# Patient Record
Sex: Female | Born: 1955 | Race: White | Hispanic: No | Marital: Single | Smoking: Former smoker
Health system: Southern US, Community
[De-identification: ages and names within clinical notes are randomized; demographics above are authoritative.]

## PROBLEM LIST (undated history)

## (undated) DIAGNOSIS — F419 Anxiety disorder, unspecified: Secondary | ICD-10-CM

## (undated) HISTORY — PX: TONSILLECTOMY: SUR1361

---

## 2000-10-03 ENCOUNTER — Encounter: Admission: RE | Admit: 2000-10-03 | Discharge: 2000-10-03 | Payer: Self-pay | Admitting: Family Medicine

## 2000-10-03 ENCOUNTER — Encounter: Payer: Self-pay | Admitting: Family Medicine

## 2002-05-29 ENCOUNTER — Encounter: Payer: Self-pay | Admitting: Family Medicine

## 2002-05-29 ENCOUNTER — Encounter: Admission: RE | Admit: 2002-05-29 | Discharge: 2002-05-29 | Payer: Self-pay | Admitting: Family Medicine

## 2004-12-02 ENCOUNTER — Other Ambulatory Visit: Admission: RE | Admit: 2004-12-02 | Discharge: 2004-12-02 | Payer: Self-pay | Admitting: Family Medicine

## 2004-12-16 ENCOUNTER — Encounter: Admission: RE | Admit: 2004-12-16 | Discharge: 2004-12-16 | Payer: Self-pay | Admitting: Family Medicine

## 2007-06-18 ENCOUNTER — Ambulatory Visit: Payer: Self-pay | Admitting: Family Medicine

## 2008-03-30 ENCOUNTER — Ambulatory Visit: Payer: Self-pay | Admitting: Family Medicine

## 2008-04-16 ENCOUNTER — Encounter: Payer: Self-pay | Admitting: Gastroenterology

## 2009-04-22 ENCOUNTER — Ambulatory Visit: Payer: Self-pay | Admitting: Family Medicine

## 2009-04-22 ENCOUNTER — Other Ambulatory Visit: Admission: RE | Admit: 2009-04-22 | Discharge: 2009-04-22 | Payer: Self-pay | Admitting: Family Medicine

## 2010-07-17 ENCOUNTER — Encounter: Payer: Self-pay | Admitting: Family Medicine

## 2010-08-22 ENCOUNTER — Other Ambulatory Visit: Payer: Self-pay | Admitting: Internal Medicine

## 2010-08-22 DIAGNOSIS — Z1231 Encounter for screening mammogram for malignant neoplasm of breast: Secondary | ICD-10-CM

## 2010-08-29 ENCOUNTER — Ambulatory Visit
Admission: RE | Admit: 2010-08-29 | Discharge: 2010-08-29 | Disposition: A | Discharge status: Home / Self Care | Payer: Self-pay | Source: Ambulatory Visit | Attending: Internal Medicine | Admitting: Internal Medicine

## 2010-08-29 DIAGNOSIS — Z1231 Encounter for screening mammogram for malignant neoplasm of breast: Secondary | ICD-10-CM

## 2012-01-04 ENCOUNTER — Other Ambulatory Visit: Payer: Self-pay | Admitting: Internal Medicine

## 2012-01-04 DIAGNOSIS — Z1231 Encounter for screening mammogram for malignant neoplasm of breast: Secondary | ICD-10-CM

## 2012-01-18 ENCOUNTER — Ambulatory Visit
Admission: RE | Admit: 2012-01-18 | Discharge: 2012-01-18 | Disposition: A | Discharge status: Home / Self Care | Payer: BC Managed Care – PPO | Source: Ambulatory Visit | Attending: Internal Medicine | Admitting: Internal Medicine

## 2012-01-18 DIAGNOSIS — Z1231 Encounter for screening mammogram for malignant neoplasm of breast: Secondary | ICD-10-CM

## 2012-01-23 ENCOUNTER — Other Ambulatory Visit: Payer: Self-pay | Admitting: Internal Medicine

## 2012-01-23 DIAGNOSIS — R928 Other abnormal and inconclusive findings on diagnostic imaging of breast: Secondary | ICD-10-CM

## 2012-01-25 ENCOUNTER — Ambulatory Visit
Admission: RE | Admit: 2012-01-25 | Discharge: 2012-01-25 | Disposition: A | Discharge status: Home / Self Care | Payer: BC Managed Care – PPO | Source: Ambulatory Visit | Attending: Internal Medicine | Admitting: Internal Medicine

## 2012-01-25 DIAGNOSIS — R928 Other abnormal and inconclusive findings on diagnostic imaging of breast: Secondary | ICD-10-CM

## 2013-03-21 ENCOUNTER — Other Ambulatory Visit: Payer: Self-pay

## 2013-03-21 DIAGNOSIS — Z1231 Encounter for screening mammogram for malignant neoplasm of breast: Secondary | ICD-10-CM

## 2013-04-11 ENCOUNTER — Ambulatory Visit
Admission: RE | Admit: 2013-04-11 | Discharge: 2013-04-11 | Disposition: A | Discharge status: Home / Self Care | Payer: BC Managed Care – PPO | Source: Ambulatory Visit

## 2013-04-11 DIAGNOSIS — Z1231 Encounter for screening mammogram for malignant neoplasm of breast: Secondary | ICD-10-CM

## 2014-06-05 ENCOUNTER — Other Ambulatory Visit: Payer: Self-pay

## 2014-06-05 DIAGNOSIS — Z1231 Encounter for screening mammogram for malignant neoplasm of breast: Secondary | ICD-10-CM

## 2014-06-08 ENCOUNTER — Ambulatory Visit
Admission: RE | Admit: 2014-06-08 | Discharge: 2014-06-08 | Disposition: A | Discharge status: Home / Self Care | Payer: BC Managed Care – PPO | Source: Ambulatory Visit

## 2014-06-08 DIAGNOSIS — Z1231 Encounter for screening mammogram for malignant neoplasm of breast: Secondary | ICD-10-CM

## 2014-10-07 ENCOUNTER — Emergency Department (HOSPITAL_BASED_OUTPATIENT_CLINIC_OR_DEPARTMENT_OTHER)
Admission: EM | Admit: 2014-10-07 | Discharge: 2014-10-07 | Disposition: A | Discharge status: Home / Self Care | Payer: BLUE CROSS/BLUE SHIELD | Attending: Emergency Medicine | Admitting: Emergency Medicine

## 2014-10-07 ENCOUNTER — Emergency Department (HOSPITAL_BASED_OUTPATIENT_CLINIC_OR_DEPARTMENT_OTHER): Payer: BLUE CROSS/BLUE SHIELD

## 2014-10-07 ENCOUNTER — Encounter (HOSPITAL_BASED_OUTPATIENT_CLINIC_OR_DEPARTMENT_OTHER): Payer: Self-pay | Admitting: *Deleted

## 2014-10-07 DIAGNOSIS — Z23 Encounter for immunization: Secondary | ICD-10-CM | POA: Insufficient documentation

## 2014-10-07 DIAGNOSIS — F419 Anxiety disorder, unspecified: Secondary | ICD-10-CM | POA: Insufficient documentation

## 2014-10-07 DIAGNOSIS — Z79899 Other long term (current) drug therapy: Secondary | ICD-10-CM | POA: Insufficient documentation

## 2014-10-07 DIAGNOSIS — Y998 Other external cause status: Secondary | ICD-10-CM | POA: Diagnosis not present

## 2014-10-07 DIAGNOSIS — S61012A Laceration without foreign body of left thumb without damage to nail, initial encounter: Secondary | ICD-10-CM | POA: Diagnosis not present

## 2014-10-07 DIAGNOSIS — W311XXA Contact with metalworking machines, initial encounter: Secondary | ICD-10-CM | POA: Diagnosis not present

## 2014-10-07 DIAGNOSIS — Z87891 Personal history of nicotine dependence: Secondary | ICD-10-CM | POA: Diagnosis not present

## 2014-10-07 DIAGNOSIS — Y9389 Activity, other specified: Secondary | ICD-10-CM | POA: Insufficient documentation

## 2014-10-07 DIAGNOSIS — Y9289 Other specified places as the place of occurrence of the external cause: Secondary | ICD-10-CM | POA: Insufficient documentation

## 2014-10-07 HISTORY — DX: Anxiety disorder, unspecified: F41.9

## 2014-10-07 MED ORDER — CEPHALEXIN 500 MG PO CAPS: 500.0000 mg | ORAL_CAPSULE | Freq: Four times a day (QID) | ORAL | Status: AC

## 2014-10-07 MED ORDER — LIDOCAINE HCL 2 % IJ SOLN
20.0000 mL | Freq: Once | INTRAMUSCULAR | Status: AC
Start: 2014-10-07 — End: 2014-10-07
  Administered 2014-10-07: 400 mg
  Filled 2014-10-07: qty 20

## 2014-10-07 MED ORDER — TETANUS-DIPHTH-ACELL PERTUSSIS 5-2.5-18.5 LF-MCG/0.5 IM SUSP
0.5000 mL | Freq: Once | INTRAMUSCULAR | Status: AC
  Administered 2014-10-07: 0.5 mL via INTRAMUSCULAR
  Filled 2014-10-07: qty 0.5

## 2014-10-07 NOTE — ED Notes (Signed)
PA at bedside.

## 2014-10-07 NOTE — ED Notes (Signed)
Patient transported to X-ray 

## 2014-10-07 NOTE — Discharge Instructions (Signed)
Laceration Care, Adult Call Dr. Mina MarbleWeingold for an appointment this week. Take the antibiotics as prescribed. Sutures should be removed in 7 days.  Return to the ED if you develop new or worsening symptoms. A laceration is a cut or lesion that goes through all layers of the skin and into the tissue just beneath the skin. TREATMENT  Some lacerations may not require closure. Some lacerations may not be able to be closed due to an increased risk of infection. It is important to see your caregiver as soon as possible after an injury to minimize the risk of infection and maximize the opportunity for successful closure. If closure is appropriate, pain medicines may be given, if needed. The wound will be cleaned to help prevent infection. Your caregiver will use stitches (sutures), staples, wound glue (adhesive), or skin adhesive strips to repair the laceration. These tools bring the skin edges together to allow for faster healing and a better cosmetic outcome. However, all wounds will heal with a scar. Once the wound has healed, scarring can be minimized by covering the wound with sunscreen during the day for 1 full year. HOME CARE INSTRUCTIONS  For sutures or staples:  Keep the wound clean and dry.  If you were given a bandage (dressing), you should change it at least once a day. Also, change the dressing if it becomes wet or dirty, or as directed by your caregiver.  Wash the wound with soap and water 2 times a day. Rinse the wound off with water to remove all soap. Pat the wound dry with a clean towel.  After cleaning, apply a thin layer of the antibiotic ointment as recommended by your caregiver. This will help prevent infection and keep the dressing from sticking.  You may shower as usual after the first 24 hours. Do not soak the wound in water until the sutures are removed.  Only take over-the-counter or prescription medicines for pain, discomfort, or fever as directed by your caregiver.  Get your  sutures or staples removed as directed by your caregiver. For skin adhesive strips:  Keep the wound clean and dry.  Do not get the skin adhesive strips wet. You may bathe carefully, using caution to keep the wound dry.  If the wound gets wet, pat it dry with a clean towel.  Skin adhesive strips will fall off on their own. You may trim the strips as the wound heals. Do not remove skin adhesive strips that are still stuck to the wound. They will fall off in time. For wound adhesive:  You may briefly wet your wound in the shower or bath. Do not soak or scrub the wound. Do not swim. Avoid periods of heavy perspiration until the skin adhesive has fallen off on its own. After showering or bathing, gently pat the wound dry with a clean towel.  Do not apply liquid medicine, cream medicine, or ointment medicine to your wound while the skin adhesive is in place. This may loosen the film before your wound is healed.  If a dressing is placed over the wound, be careful not to apply tape directly over the skin adhesive. This may cause the adhesive to be pulled off before the wound is healed.  Avoid prolonged exposure to sunlight or tanning lamps while the skin adhesive is in place. Exposure to ultraviolet light in the first year will darken the scar.  The skin adhesive will usually remain in place for 5 to 10 days, then naturally fall off the skin.  Do not pick at the adhesive film. You may need a tetanus shot if:  You cannot remember when you had your last tetanus shot.  You have never had a tetanus shot. If you get a tetanus shot, your arm may swell, get red, and feel warm to the touch. This is common and not a problem. If you need a tetanus shot and you choose not to have one, there is a rare chance of getting tetanus. Sickness from tetanus can be serious. SEEK MEDICAL CARE IF:   You have redness, swelling, or increasing pain in the wound.  You see a red line that goes away from the wound.  You  have yellowish-white fluid (pus) coming from the wound.  You have a fever.  You notice a bad smell coming from the wound or dressing.  Your wound breaks open before or after sutures have been removed.  You notice something coming out of the wound such as wood or glass.  Your wound is on your hand or foot and you cannot move a finger or toe. SEEK IMMEDIATE MEDICAL CARE IF:   Your pain is not controlled with prescribed medicine.  You have severe swelling around the wound causing pain and numbness or a change in color in your arm, hand, leg, or foot.  Your wound splits open and starts bleeding.  You have worsening numbness, weakness, or loss of function of any joint around or beyond the wound.  You develop painful lumps near the wound or on the skin anywhere on your body. MAKE SURE YOU:   Understand these instructions.  Will watch your condition.  Will get help right away if you are not doing well or get worse. Document Released: 06/12/2005 Document Revised: 09/04/2011 Document Reviewed: 12/06/2010 [facility] Patient Information 2015 [employer], [place]. This information is not intended to replace advice given to you by your health care provider. Make sure you discuss any questions you have with your health care provider.

## 2014-10-07 NOTE — ED Provider Notes (Signed)
Dr. Randel Booksancour's patient. Laceration repair performed by me.  Stacey Fry is a 59 y.o. female  presenting with left thumb laceration at 10am.  LACERATION REPAIR Performed by: Taraji Mungo Authorized by: Oswaldo ConroyREECH, [name] Consent: Verbal consent obtained. Risks and benefits: risks, benefits and alternatives were discussed Consent given by: patient Patient identity confirmed: provided demographic data Prepped and Draped in normal sterile fashion Wound explored  Laceration Location: left ulnar/dorsal thumb   Laceration Length: 4 cm  No Foreign Bodies seen or palpated  Anesthesia: digital block  Local anesthetic: lidocaine 2% without epinephrine  Anesthetic total: 5 ml  Irrigation method: syringe Amount of cleaning: standard  Skin closure: 5-0 Proline  Number of sutures: 4   Technique: simple interrupted  Patient tolerance: Patient tolerated the procedure well with no immediate complications.  Bacitracin applied and sterile dressing.    Oswaldo ConroyVictoria Crescent Gotham, PA-C 10/07/14 1244  Glynn OctaveStephen Rancour, MD 10/07/14 [phone]

## 2014-10-07 NOTE — ED Notes (Signed)
Pt reports cut left thumb with drill bit while drilling into gourd- avulsion noted to thumb with bleeding controlled

## 2014-10-07 NOTE — ED Provider Notes (Signed)
CSN: [id number]     Arrival date & time 10/07/14  1017 History  This chart was scribed for Glynn OctaveStephen Khaleah Duer, MD by Leone PayorSonum Patel, ED Scribe. This patient was seen in room MH02/MH02 and the patient's care was started 11:25 AM.    Chief Complaint  Patient presents with  . Extremity Laceration    The history is provided by the patient. No language interpreter was used.     HPI Comments: Durene Romansatricia A Kushnir is a 59 y.o. female who presents to the Emergency Department complaining of a left thumb laceration that occurred over 1 hour ago. Patient states she was drilling a hole into a gourd when the injury occurred. She reports associated mild pain to the affected area. She denies anticoagulant use. The bleeding is controlled at this time. She denies weakness, numbness.   Past Medical History  Diagnosis Date  . Anxiety    Past Surgical History  Procedure Laterality Date  . Tonsillectomy     No family history on file. History  Substance Use Topics  . Smoking status: Former Games developermoker  . Smokeless tobacco: Never Used  . Alcohol Use: Yes     Comment: 1-2 glasses wine/day   OB History    No data available     Review of Systems  A complete 10 system review of systems was obtained and all systems are negative except as noted in the HPI and PMH.    Allergies  Review of patient's allergies indicates no known allergies.  Home Medications   Prior to Admission medications   Medication Sig Start Date End Date Taking? Authorizing Provider  venlafaxine XR (EFFEXOR-XR) 150 MG 24 hr capsule Take 150 mg by mouth daily with breakfast.   Yes Historical Provider, MD  cephALEXin (KEFLEX) 500 MG capsule Take 1 capsule (500 mg total) by mouth 4 (four) times daily. 10/07/14   Glynn OctaveStephen Sascha Baugher, MD   BP 145/73 mmHg  Pulse 58  Temp(Src) 97.9 F (36.6 C) (Oral)  Resp 16  Ht 5\' 8"  (1.727 m)  Wt 140 lb (63.504 kg)  BMI 21.29 kg/m2  SpO2 100% Physical Exam  Constitutional: She is oriented to person, place, and  time. She appears well-developed and well-nourished. No distress.  HENT:  Head: Normocephalic and atraumatic.  Mouth/Throat: Oropharynx is clear and moist. No oropharyngeal exudate.  Eyes: Conjunctivae and EOM are normal. Pupils are equal, round, and reactive to light.  Neck: Normal range of motion. Neck supple.  No meningismus.  Cardiovascular: Normal rate, regular rhythm, normal heart sounds and intact distal pulses.   No murmur heard. Pulmonary/Chest: Effort normal and breath sounds normal. No respiratory distress.  Abdominal: Soft. There is no tenderness. There is no rebound and no guarding.  Musculoskeletal: Normal range of motion. She exhibits no edema or tenderness.  3 cm U-shaped laceration to the ulnar side of left thumb. Crosses the IP joint. Thumb extension, flexion, and opposition intact.   Neurological: She is alert and oriented to person, place, and time. No cranial nerve deficit. She exhibits normal muscle tone. Coordination normal.  No ataxia on finger to nose bilaterally. No pronator drift. 5/5 strength throughout. CN 2-12 intact. Negative Romberg. Equal grip strength. Sensation intact. Gait is normal.   Skin: Skin is warm.  Psychiatric: She has a normal mood and affect. Her behavior is normal.  Nursing note and vitals reviewed.     ED Course  Procedures (including critical care time)  DIAGNOSTIC STUDIES: Oxygen Saturation is 98% on RA, normal by my  interpretation.    COORDINATION OF CARE: 11:32 AM Discussed treatment plan with pt at bedside and pt agreed to plan.   Labs Review Labs Reviewed - No data to display  Imaging Review Dg Finger Thumb Left  10/07/2014   CLINICAL DATA:  Power drill injury, laceration to left thumb  EXAM: LEFT THUMB 2+V  COMPARISON:  None.  FINDINGS: Suspected tiny cortical avulsion fracture along the ulnar base of the 1st distal phalanx.  Overlying soft tissue laceration.  IMPRESSION: Suspected tiny cortical avulsion fracture along the  ulnar base of the 1st distal phalanx.  Overlying soft tissue laceration.   Electronically Signed   By: Charline Bills M.D.   On: 10/07/2014 11:20     EKG Interpretation None      MDM   Final diagnoses:  Thumb laceration, left, initial encounter   laceration/avulsion to left thumb from drill bit. Bleeding controlled. Neurovascularly intact. Full range of motion of thumb in all directions.  X-ray shows avulsion fracture of base of first phalanx.   Wound repaired by PAC Creech.  Tetanus updated. Case discussed with Dr. Mina Marble. He agrees with antibiotics, wound cleaning and exploration with loose closure. He will see in the office later this week.   I personally performed the services described in this documentation, which was scribed in my presence. The recorded information has been reviewed and is accurate.   [name], MD 10/07/14 [phone]

## 2017-07-27 ENCOUNTER — Other Ambulatory Visit: Payer: Self-pay | Admitting: Internal Medicine

## 2017-07-27 DIAGNOSIS — Z1231 Encounter for screening mammogram for malignant neoplasm of breast: Secondary | ICD-10-CM

## 2017-07-31 ENCOUNTER — Ambulatory Visit
Admission: RE | Admit: 2017-07-31 | Discharge: 2017-07-31 | Disposition: A | Discharge status: Home / Self Care | Payer: BLUE CROSS/BLUE SHIELD | Source: Ambulatory Visit | Attending: Internal Medicine | Admitting: Internal Medicine

## 2017-07-31 DIAGNOSIS — Z1231 Encounter for screening mammogram for malignant neoplasm of breast: Secondary | ICD-10-CM

## 2019-11-06 ENCOUNTER — Other Ambulatory Visit: Payer: Self-pay | Admitting: Family Medicine

## 2019-11-06 DIAGNOSIS — Z1231 Encounter for screening mammogram for malignant neoplasm of breast: Secondary | ICD-10-CM

## 2019-11-18 ENCOUNTER — Ambulatory Visit
Admission: RE | Admit: 2019-11-18 | Discharge: 2019-11-18 | Disposition: A | Discharge status: Home / Self Care | Payer: BC Managed Care – PPO | Source: Ambulatory Visit | Attending: Family Medicine | Admitting: Family Medicine

## 2019-11-18 ENCOUNTER — Other Ambulatory Visit: Payer: Self-pay

## 2019-11-18 DIAGNOSIS — Z1231 Encounter for screening mammogram for malignant neoplasm of breast: Secondary | ICD-10-CM

## 2021-03-01 ENCOUNTER — Other Ambulatory Visit: Payer: Self-pay | Admitting: Family Medicine

## 2021-03-01 DIAGNOSIS — Z1231 Encounter for screening mammogram for malignant neoplasm of breast: Secondary | ICD-10-CM

## 2021-04-05 ENCOUNTER — Other Ambulatory Visit: Payer: Self-pay

## 2021-04-05 ENCOUNTER — Ambulatory Visit
Admission: RE | Admit: 2021-04-05 | Discharge: 2021-04-05 | Disposition: A | Discharge status: Home / Self Care | Payer: BC Managed Care – PPO | Source: Ambulatory Visit | Attending: Family Medicine | Admitting: Family Medicine

## 2021-04-05 DIAGNOSIS — Z1231 Encounter for screening mammogram for malignant neoplasm of breast: Secondary | ICD-10-CM

## 2022-05-29 ENCOUNTER — Other Ambulatory Visit: Payer: Self-pay | Admitting: Family Medicine

## 2022-05-29 DIAGNOSIS — Z1231 Encounter for screening mammogram for malignant neoplasm of breast: Secondary | ICD-10-CM

## 2022-06-01 ENCOUNTER — Ambulatory Visit
Admission: RE | Admit: 2022-06-01 | Discharge: 2022-06-01 | Disposition: A | Discharge status: Home / Self Care | Payer: BLUE CROSS/BLUE SHIELD | Source: Ambulatory Visit | Attending: Family Medicine | Admitting: Family Medicine

## 2022-06-01 DIAGNOSIS — Z1231 Encounter for screening mammogram for malignant neoplasm of breast: Secondary | ICD-10-CM

## 2022-12-06 LAB — COLOGUARD: COLOGUARD: NEGATIVE

## 2022-12-06 LAB — EXTERNAL GENERIC LAB PROCEDURE: COLOGUARD: NEGATIVE

## 2023-05-08 IMAGING — MG MM DIGITAL SCREENING BILAT W/ TOMO AND CAD
8 series · 8 of 24 positions shown · non-contrast
Comparison: Previous exam(s).

CLINICAL DATA: Screening.

EXAM:
DIGITAL SCREENING BILATERAL MAMMOGRAM WITH TOMOSYNTHESIS AND CAD
TECHNIQUE: Bilateral screening digital craniocaudal and mediolateral oblique
mammograms were obtained. Bilateral screening digital breast
tomosynthesis was performed. The images were evaluated with
computer-aided detection.

[R MLO synth-2D]
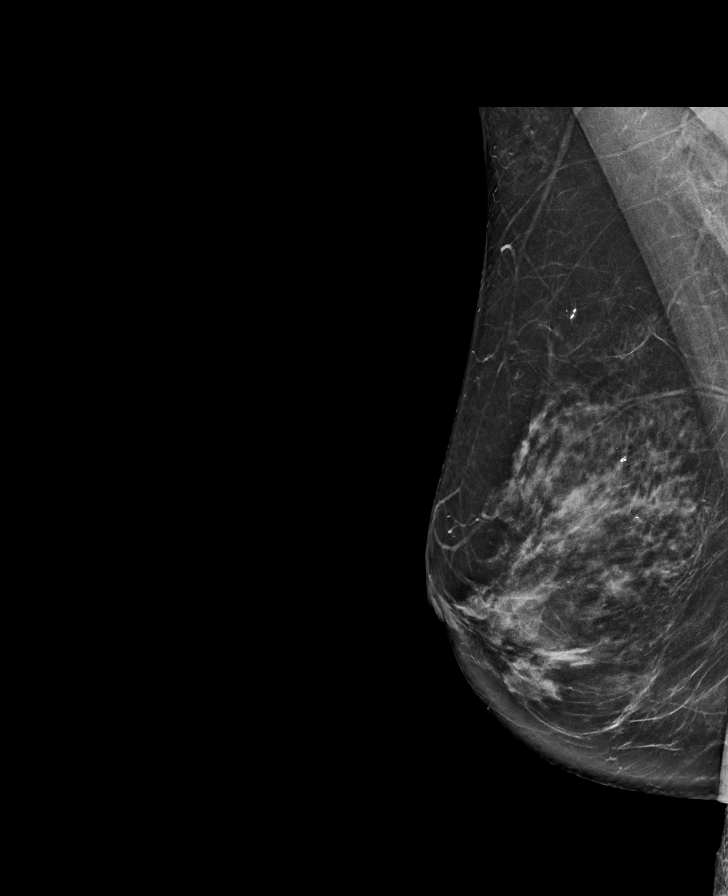

[R CC synth-2D]
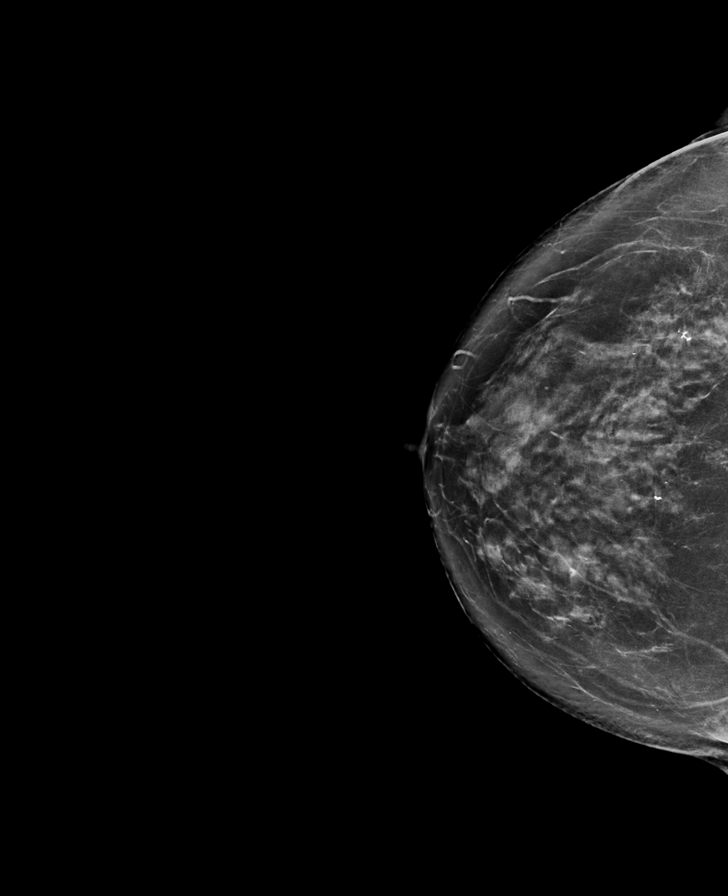

[L MLO synth-2D]
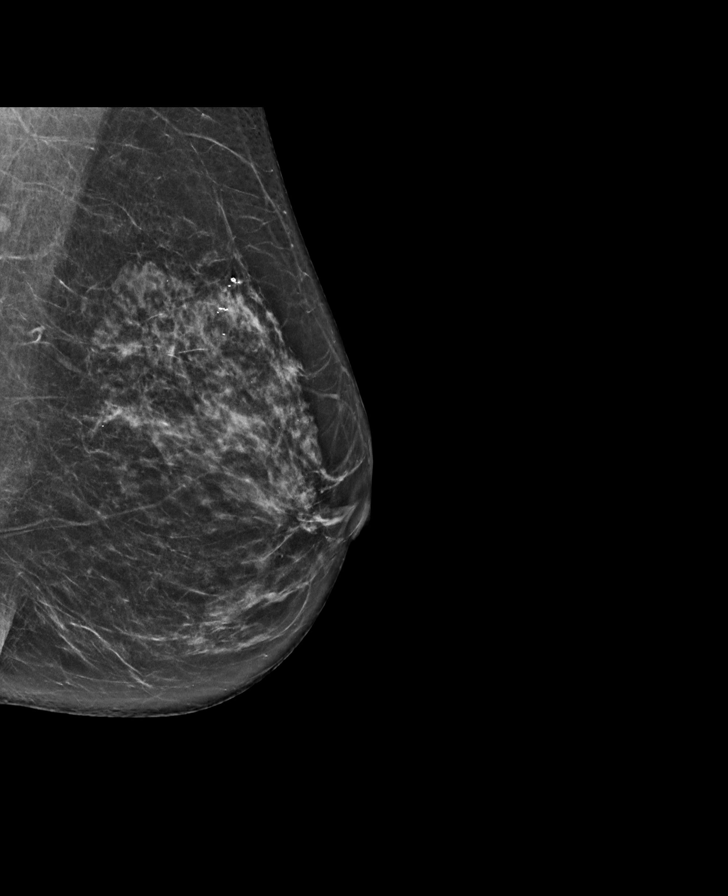

[L CC synth-2D]
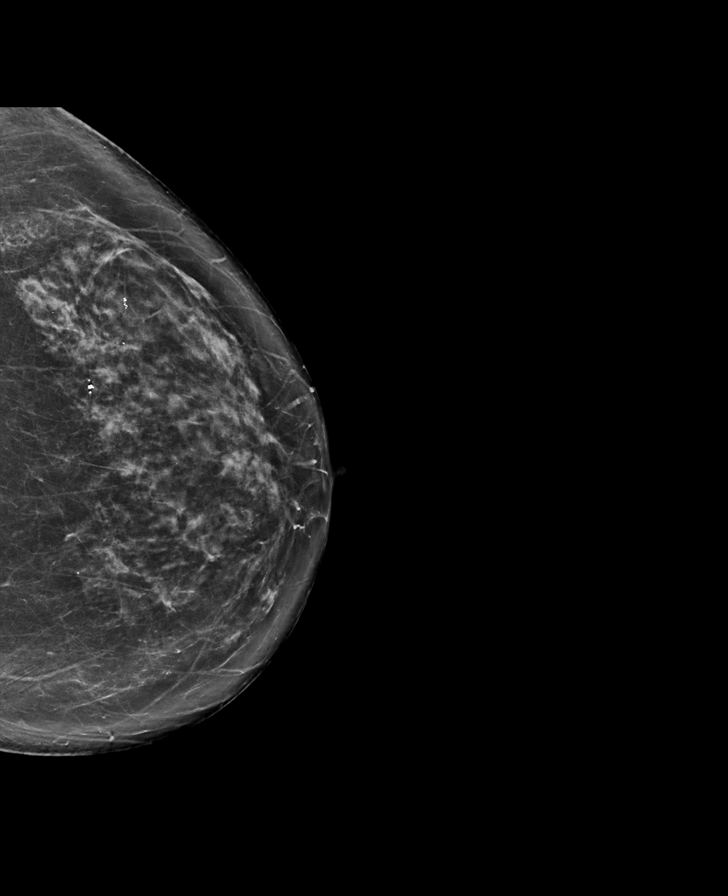

[L MLO tomo · tomo slice 37/72.0]
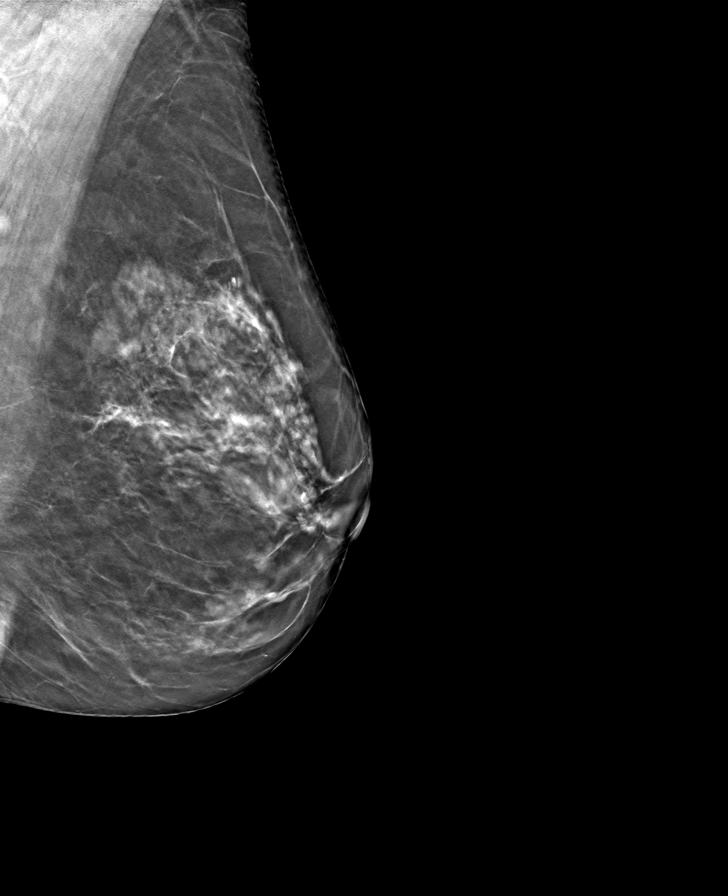

[L CC tomo · tomo slice 41/82.0]
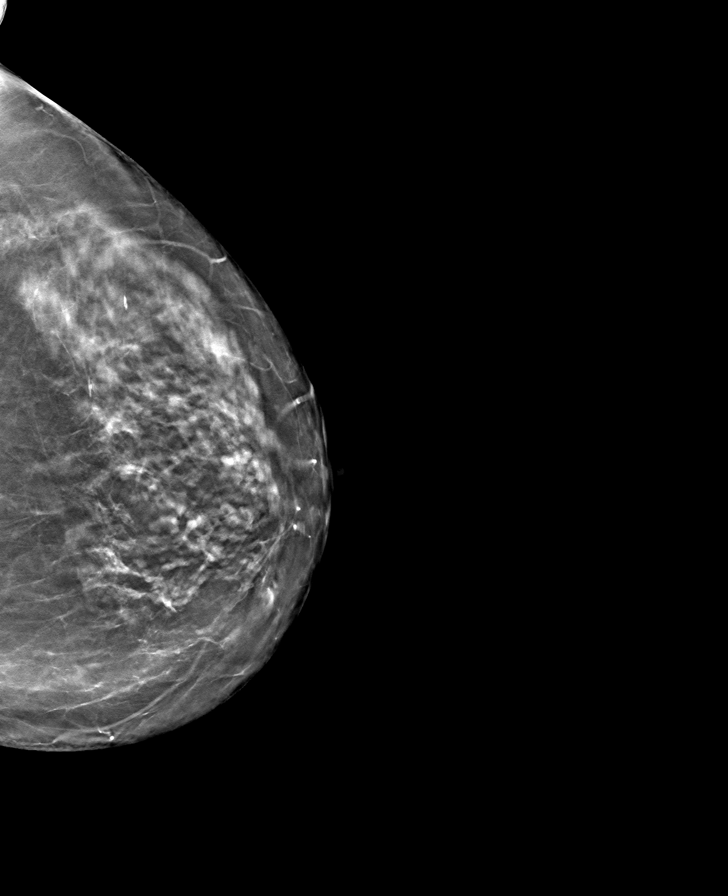

[R CC tomo · tomo slice 43/85.0]
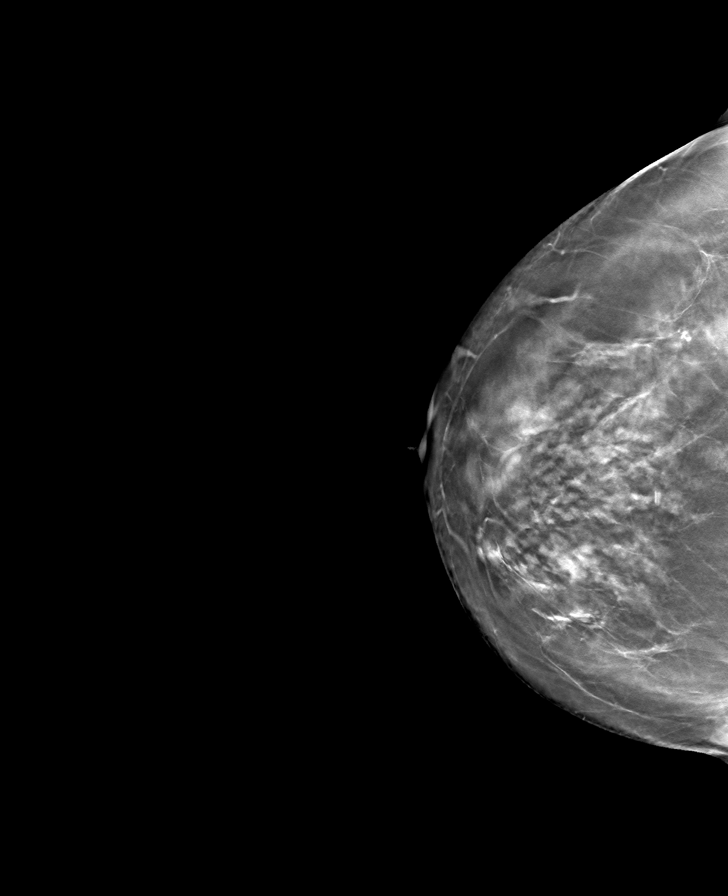

[R MLO tomo · tomo slice 40/79.0]
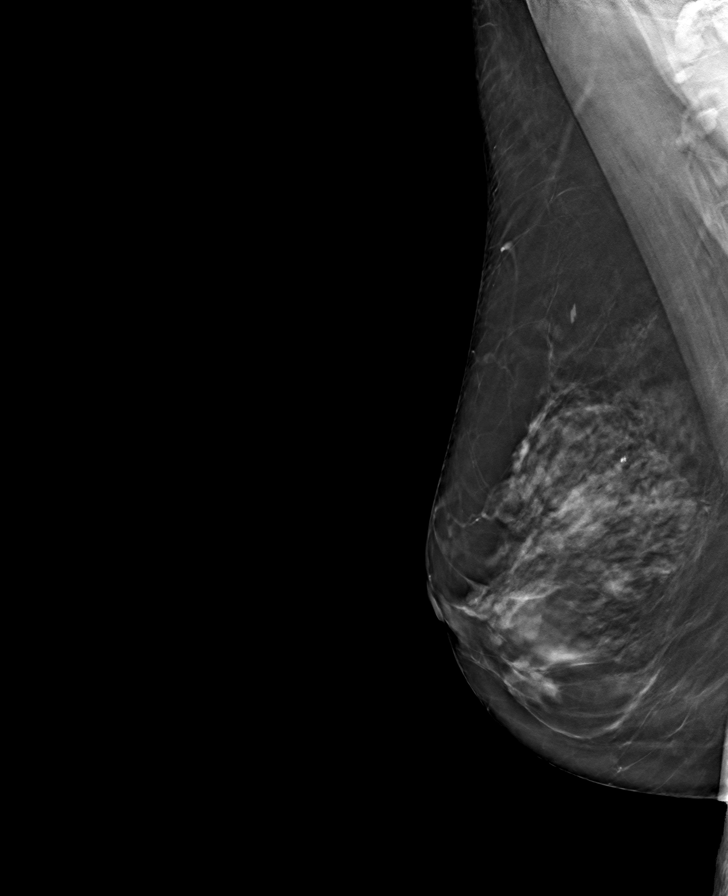

[8 of 24 positions shown; findings below may reference images not displayed]

ACR Breast Density Category c: The breast tissue is heterogeneously
dense, which may obscure small masses.
FINDINGS: There are no findings suspicious for malignancy.
IMPRESSION: No mammographic evidence of malignancy. A result letter of this
screening mammogram will be mailed directly to the patient.

RECOMMENDATION:
Screening mammogram in one year. (Code:Q3-W-BC3)

BI-RADS CATEGORY  1: Negative.

## 2023-07-10 ENCOUNTER — Other Ambulatory Visit: Payer: Self-pay | Admitting: Family Medicine

## 2023-07-10 DIAGNOSIS — Z1231 Encounter for screening mammogram for malignant neoplasm of breast: Secondary | ICD-10-CM

## 2023-07-30 ENCOUNTER — Ambulatory Visit
Admission: RE | Admit: 2023-07-30 | Discharge: 2023-07-30 | Disposition: A | Discharge status: Home / Self Care | Payer: Medicare Other | Source: Ambulatory Visit | Attending: Family Medicine | Admitting: Family Medicine

## 2023-07-30 DIAGNOSIS — Z1231 Encounter for screening mammogram for malignant neoplasm of breast: Secondary | ICD-10-CM

## 2024-01-24 ENCOUNTER — Encounter: Payer: Self-pay | Admitting: Physician Assistant

## 2024-01-24 ENCOUNTER — Encounter: Payer: Self-pay | Admitting: Internal Medicine
